# Patient Record
Sex: Male | Born: 1980 | Race: Black or African American | Hispanic: No | Marital: Single | Smoking: Never smoker
Health system: Southern US, Community
[De-identification: ages and names within clinical notes are randomized; demographics above are authoritative.]

---

## 2006-01-14 ENCOUNTER — Emergency Department (HOSPITAL_COMMUNITY): Admission: EM | Admit: 2006-01-14 | Discharge: 2006-01-14 | Payer: Self-pay | Admitting: Emergency Medicine

## 2006-11-23 ENCOUNTER — Emergency Department (HOSPITAL_COMMUNITY): Admission: EM | Admit: 2006-11-23 | Discharge: 2006-11-23 | Payer: Self-pay | Admitting: Emergency Medicine

## 2007-04-23 ENCOUNTER — Emergency Department (HOSPITAL_COMMUNITY): Admission: EM | Admit: 2007-04-23 | Discharge: 2007-04-23 | Payer: Self-pay | Admitting: Emergency Medicine

## 2008-07-07 ENCOUNTER — Emergency Department (HOSPITAL_COMMUNITY): Admission: EM | Admit: 2008-07-07 | Discharge: 2008-07-07 | Payer: Self-pay | Admitting: Emergency Medicine

## 2011-10-11 ENCOUNTER — Emergency Department (HOSPITAL_COMMUNITY)
Admission: EM | Admit: 2011-10-11 | Discharge: 2011-10-11 | Disposition: A | Discharge status: Home / Self Care | Payer: Self-pay | Attending: Emergency Medicine | Admitting: Emergency Medicine

## 2011-10-11 ENCOUNTER — Emergency Department (HOSPITAL_COMMUNITY): Payer: Self-pay

## 2011-10-11 ENCOUNTER — Encounter (HOSPITAL_COMMUNITY): Payer: Self-pay | Admitting: *Deleted

## 2011-10-11 DIAGNOSIS — S0083XA Contusion of other part of head, initial encounter: Secondary | ICD-10-CM | POA: Insufficient documentation

## 2011-10-11 DIAGNOSIS — IMO0002 Reserved for concepts with insufficient information to code with codable children: Secondary | ICD-10-CM | POA: Insufficient documentation

## 2011-10-11 DIAGNOSIS — S40019A Contusion of unspecified shoulder, initial encounter: Secondary | ICD-10-CM

## 2011-10-11 DIAGNOSIS — S0510XA Contusion of eyeball and orbital tissues, unspecified eye, initial encounter: Secondary | ICD-10-CM | POA: Insufficient documentation

## 2011-10-11 DIAGNOSIS — S0003XA Contusion of scalp, initial encounter: Secondary | ICD-10-CM | POA: Insufficient documentation

## 2011-10-11 DIAGNOSIS — S0990XA Unspecified injury of head, initial encounter: Secondary | ICD-10-CM

## 2011-10-11 MED ORDER — HYDROCODONE-ACETAMINOPHEN 5-325 MG PO TABS
1.0000 | ORAL_TABLET | Freq: Once | ORAL | Status: AC
  Administered 2011-10-11: 1 via ORAL
  Filled 2011-10-11: qty 1

## 2011-10-11 MED ORDER — HYDROCODONE-ACETAMINOPHEN 5-325 MG PO TABS: 1.0000 | ORAL_TABLET | Freq: Four times a day (QID) | ORAL | Status: AC | PRN

## 2011-10-11 NOTE — Discharge Instructions (Signed)
Follow up as needed
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
## 2016-06-30 ENCOUNTER — Encounter (HOSPITAL_COMMUNITY): Payer: Self-pay | Admitting: Emergency Medicine

## 2016-06-30 ENCOUNTER — Emergency Department (HOSPITAL_COMMUNITY)
Admission: EM | Admit: 2016-06-30 | Discharge: 2016-06-30 | Disposition: A | Discharge status: Home / Self Care | Payer: Self-pay | Attending: Emergency Medicine | Admitting: Emergency Medicine

## 2016-06-30 ENCOUNTER — Emergency Department (HOSPITAL_COMMUNITY): Payer: Self-pay

## 2016-06-30 DIAGNOSIS — Y939 Activity, unspecified: Secondary | ICD-10-CM | POA: Insufficient documentation

## 2016-06-30 DIAGNOSIS — Y999 Unspecified external cause status: Secondary | ICD-10-CM | POA: Insufficient documentation

## 2016-06-30 DIAGNOSIS — X58XXXA Exposure to other specified factors, initial encounter: Secondary | ICD-10-CM | POA: Insufficient documentation

## 2016-06-30 DIAGNOSIS — S39012A Strain of muscle, fascia and tendon of lower back, initial encounter: Secondary | ICD-10-CM | POA: Insufficient documentation

## 2016-06-30 DIAGNOSIS — Y929 Unspecified place or not applicable: Secondary | ICD-10-CM | POA: Insufficient documentation

## 2016-06-30 MED ORDER — DEXAMETHASONE 4 MG PO TABS: 4.0000 mg | ORAL_TABLET | Freq: Two times a day (BID) | ORAL | 0 refills | Status: AC

## 2016-06-30 MED ORDER — DIAZEPAM 5 MG PO TABS
10.0000 mg | ORAL_TABLET | Freq: Once | ORAL | Status: AC
  Administered 2016-06-30: 10 mg via ORAL
  Filled 2016-06-30: qty 2

## 2016-06-30 MED ORDER — HYDROCODONE-ACETAMINOPHEN 5-325 MG PO TABS
2.0000 | ORAL_TABLET | Freq: Once | ORAL | Status: AC
  Administered 2016-06-30: 2 via ORAL
  Filled 2016-06-30: qty 2

## 2016-06-30 MED ORDER — ONDANSETRON HCL 4 MG PO TABS: 4.0000 mg | ORAL_TABLET | Freq: Four times a day (QID) | ORAL | 0 refills | Status: AC

## 2016-06-30 MED ORDER — CYCLOBENZAPRINE HCL 10 MG PO TABS: 10.0000 mg | ORAL_TABLET | Freq: Three times a day (TID) | ORAL | 0 refills | Status: AC

## 2016-06-30 MED ORDER — ONDANSETRON HCL 4 MG PO TABS
4.0000 mg | ORAL_TABLET | Freq: Once | ORAL | Status: AC
  Administered 2016-06-30: 4 mg via ORAL
  Filled 2016-06-30: qty 1
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
## 2016-06-30 NOTE — ED Provider Notes (Signed)
[facility]-EMERGENCY DEPT Provider Note   CSN: [id number] Arrival date & time: 06/30/16  [id number]     History   Chief Complaint Chief Complaint  Patient presents with  . Back Pain    HPI Micheal Carpenter is a 36 y.o. male.  Patient is a 36 year old male who presents to the emergency department with complaint of low back pain and tingling in both legs.  The patient states that on last night he started having some pain in his lower back. He states he's had some pains in his back and his neck area in the past but he can usually rest or use some over-the-counter medication and it goes away. He states that this morning he could not take the pain. He states that he had to use a standup most of the night, or feel his knees beside the bed. The pain is aggravated with change of position. Nothing really seems to make it better other than sometimes standing up and being still. He has not had any recent operations or procedures involving his back. He does not recall any recent injury. He does state however that he was standing on an object, and he removed a wide screen television, but he states that he did not feel any discomfort when he was doing the moving a few days ago. There's been no loss of bowel or bladder function. Is no recurrence fall.      History reviewed. No pertinent past medical history.  There are no active problems to display for this patient.   History reviewed. No pertinent surgical history.     Home Medications    Prior to Admission medications   Not on File    Family History History reviewed. No pertinent family history.  Social History Social History  Substance Use Topics  . Smoking status: Never Smoker  . Smokeless tobacco: Not on file  . Alcohol use Yes     Allergies   Aspirin   Review of Systems Review of Systems  Musculoskeletal: Positive for back pain.  All other systems reviewed and are negative.    Physical Exam Updated Vital Signs BP 118/88 (BP  Location: Left Arm)   Pulse (!) 55   Temp 98.7 F (37.1 C) (Oral)   Resp 18   Ht 6\' 6"  (1.981 m)   Wt 77.1 kg   SpO2 100%   BMI 19.65 kg/m   Physical Exam  Constitutional: He is oriented to person, place, and time. He appears well-developed and well-nourished.  Non-toxic appearance.  HENT:  Head: Normocephalic.  Right Ear: Tympanic membrane and external ear normal.  Left Ear: Tympanic membrane and external ear normal.  Eyes: EOM and lids are normal. Pupils are equal, round, and reactive to light.  Neck: Normal range of motion. Neck supple. Carotid bruit is not present.  Cardiovascular: Normal rate, regular rhythm, normal heart sounds, intact distal pulses and normal pulses.   Pulmonary/Chest: Breath sounds normal. No respiratory distress.  Abdominal: Soft. Bowel sounds are normal. There is no tenderness. There is no guarding.  Musculoskeletal: Normal range of motion.  There is pain with change of position at the lower lumbar area. There is no palpable step off of the cervical, thoracic, or lumbar region. There is no paraspinal tenderness to palpation at the lumbar area. There no hot areas appreciated.  Lymphadenopathy:       Head (right side): No submandibular adenopathy present.       Head (left side): No submandibular adenopathy present.  He has no cervical adenopathy.  Neurological: He is alert and oriented to person, place, and time. He has normal strength. No cranial nerve deficit or sensory deficit.  Gait is slow but steady. There no motor or sensory deficits appreciated.  Skin: Skin is warm and dry.  Psychiatric: He has a normal mood and affect. His speech is normal.  Nursing note and vitals reviewed.    ED Treatments / Results  Labs (all labs ordered are listed, but only abnormal results are displayed) Labs Reviewed - No data to display  EKG  EKG Interpretation None       Radiology Ct Lumbar Spine Wo Contrast  Result Date: 06/30/2016 CLINICAL DATA:  Low  back pain. Tingling sensation to bilateral legs. No known injury. EXAM: CT LUMBAR SPINE WITHOUT CONTRAST TECHNIQUE: Multidetector CT imaging of the lumbar spine was performed without intravenous contrast administration. Multiplanar CT image reconstructions were also generated. COMPARISON:  None. FINDINGS: Segmentation: 5 lumbar type vertebrae. Alignment: Normal Vertebrae: No fracture or focal pathologic process. Paraspinal and other soft tissues: Negative Disc levels: Disc spaces maintained. No disc herniation. No neural foraminal or central spinal stenosis. IMPRESSION: No acute or significant abnormality. Electronically Signed   By: Charlett Nose M.D.   On: 06/30/2016 13:03    Procedures Procedures (including critical care time)  Medications Ordered in ED Medications  diazepam (VALIUM) tablet 10 mg (10 mg Oral Given 06/30/16 1154)  HYDROcodone-acetaminophen (NORCO/VICODIN) 5-325 MG per tablet 2 tablet (2 tablets Oral Given 06/30/16 1153)  ondansetron (ZOFRAN) tablet 4 mg (4 mg Oral Given 06/30/16 1153)     Initial Impression / Assessment and Plan / ED Course  I have reviewed the triage vital signs and the nursing notes.  Pertinent labs & imaging results that were available during my care of the patient were reviewed by me and considered in my medical decision making (see chart for details).     **I have reviewed nursing notes, vital signs, and all appropriate lab and imaging results for this patient.*  Final Clinical Impressions(s) / ED Diagnoses  MDM  No gross neurologic deficits appreciated. Pain reproduced with change of position. At times the patient was standing, and at times the patient had to get down on his knees and angle his back in a certain position to decrease the pain and discomfort. The patient was treated with Valium and hydrocodone. The patient received some improvement. CT scan of the lumbar spine is negative for fracture, dislocation, or evidence of disc disease.  I  discussed these findings with the patient in terms which he understands. The patient states that he feels a lot better after the medication. Suspect the patient has a deep muscle strain. The patient is asked to rest his back, to use a heating pad. The patient will be provided with prescription for Flexeril, Decadron, and diclofenac. The patient is to follow-up with Dr. Romeo Apple for orthopedic evaluation and management if not improving.    Final diagnoses:  None    New Prescriptions New Prescriptions   No medications on file     Ivery Quale, PA-C 06/30/16 1428    Loren Racer, MD 07/02/16 [phone]

## 2016-06-30 NOTE — ED Notes (Signed)
Pt on his knees in floor laying across bed stating that is most comfortable position

## 2016-06-30 NOTE — Discharge Instructions (Signed)
Please use a heating pad to your lower back. Please rest your back is much possible. Use Flexeril, Decadron, for your back pain. Your CT scan is negative for acute problem. I suspect you have a muscle strain. Please see Dr. Romeo AppleHarrison for orthopedic evaluation and management if not improving.

## 2018-02-26 IMAGING — CT CT L SPINE W/O CM
3 series · 12 of 33 positions shown, 14 images · non-contrast
Comparison: None.

CLINICAL DATA: Low back pain. Tingling sensation to bilateral legs.
No known injury.

EXAM:
CT LUMBAR SPINE WITHOUT CONTRAST
TECHNIQUE: Multidetector CT imaging of the lumbar spine was performed without
intravenous contrast administration. Multiplanar CT image
reconstructions were also generated.

[Series 4: l spine soft · axial · 0.36mm/px · z∈[+1114,+1306]mm · 4 of 140 slices shown, 5 images]
[im 22/140  soft-tissue]
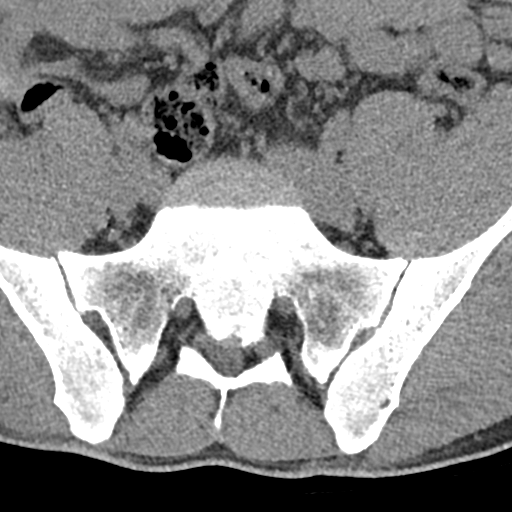
[im 22/140  bone]
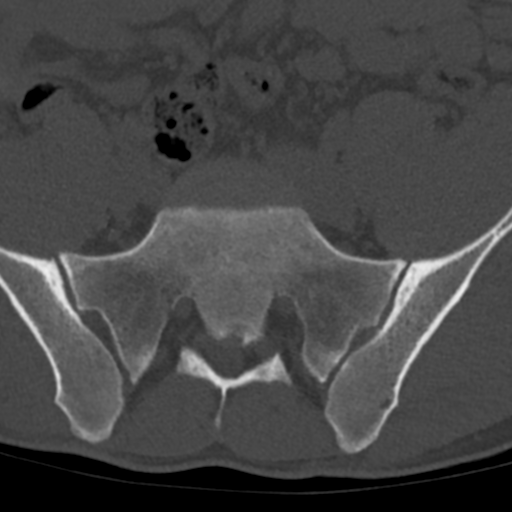
[im 54/140  bone]
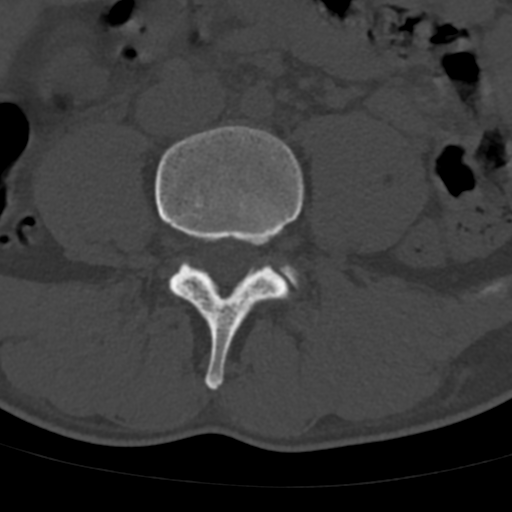
[im 86/140  bone]
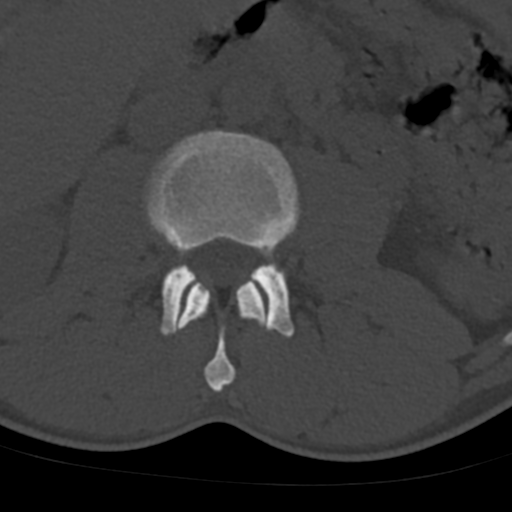
[im 118/140  bone]
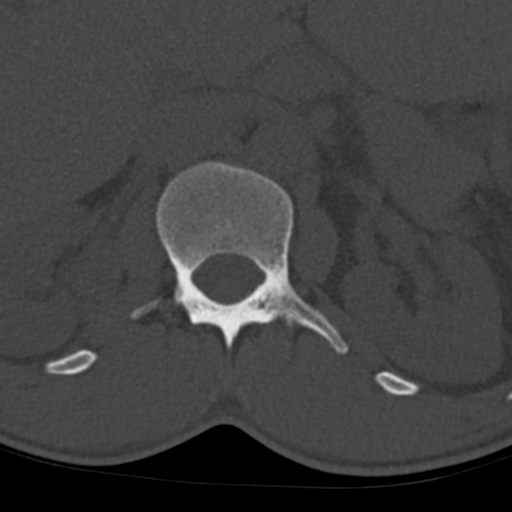

[Series 5: sagittal bone · sagittal · 0.33mm/px · 5 of 82 slices shown, 6 images]
[im 28/82  bone]
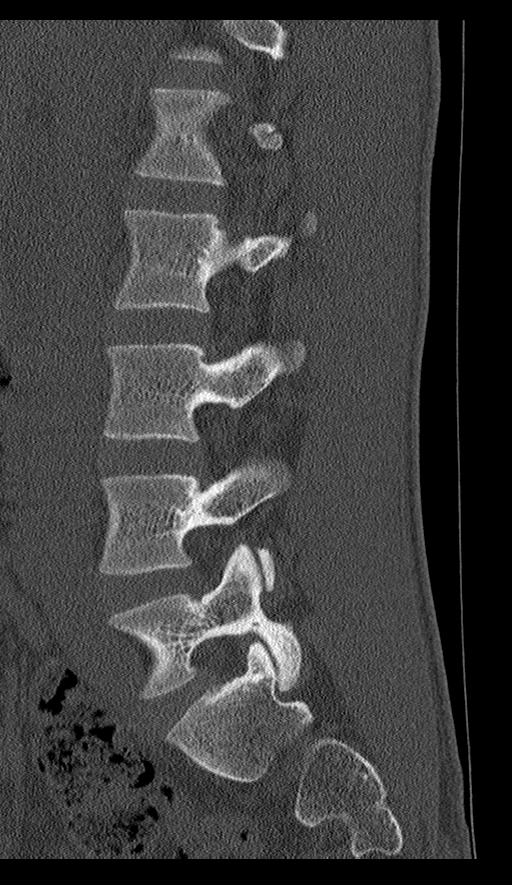
[im 34/82  bone]
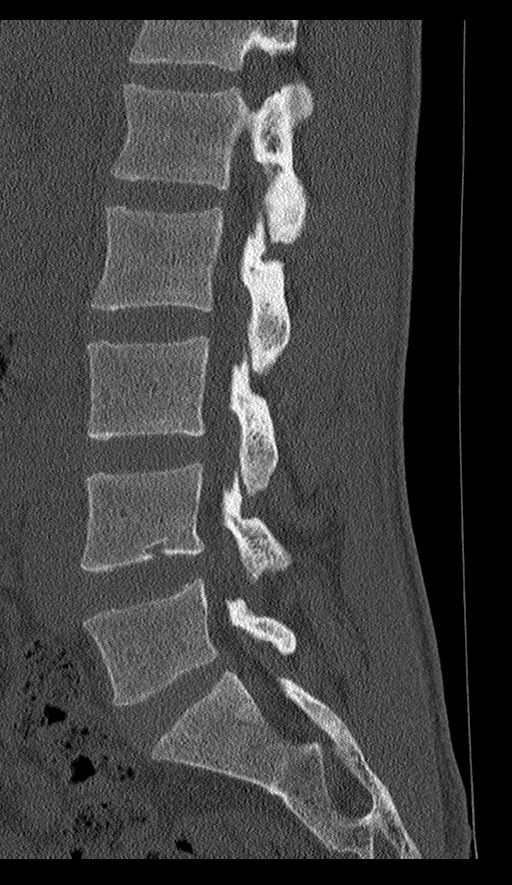
[im 41/82  soft-tissue]
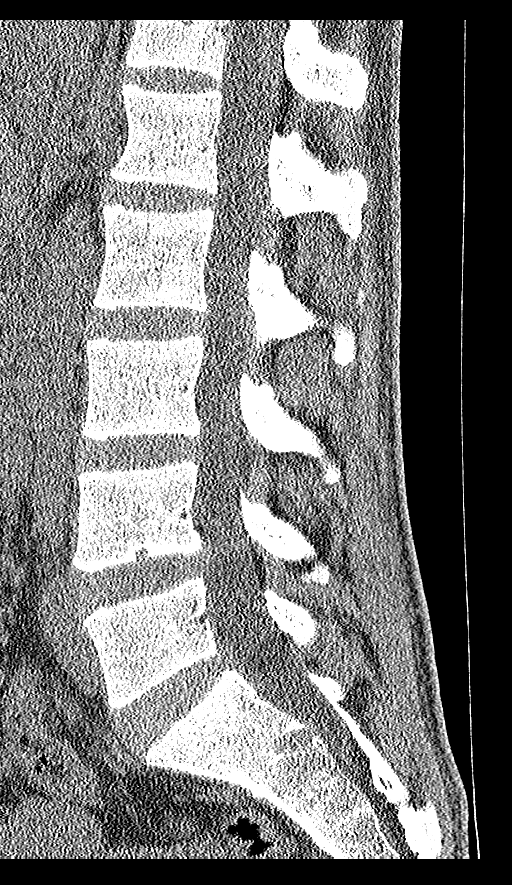
[im 41/82  bone]
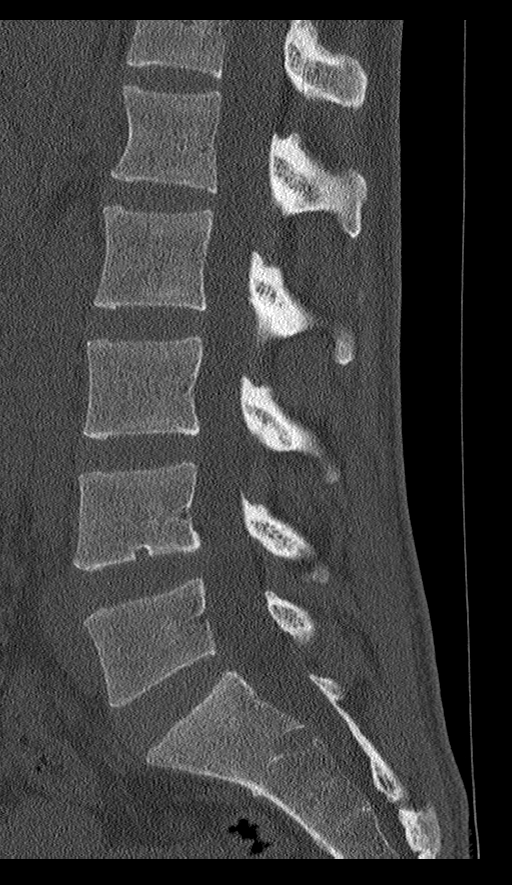
[im 48/82  bone]
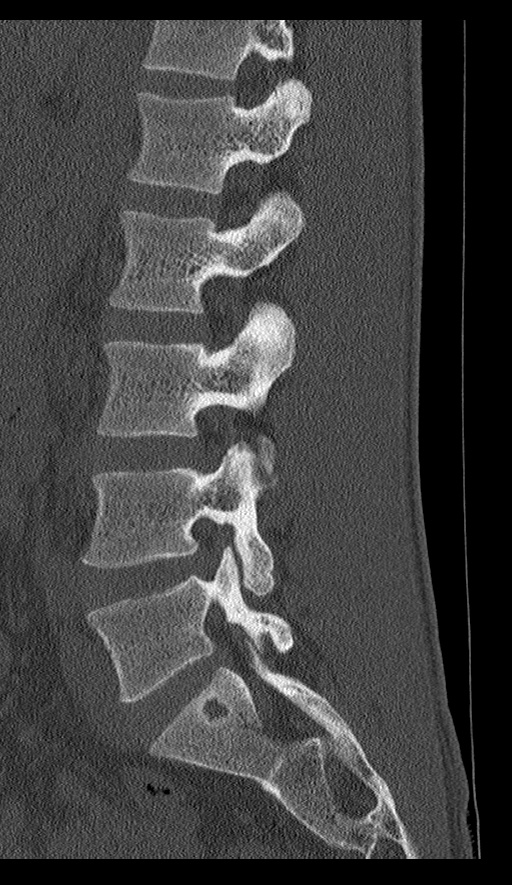
[im 55/82  bone]
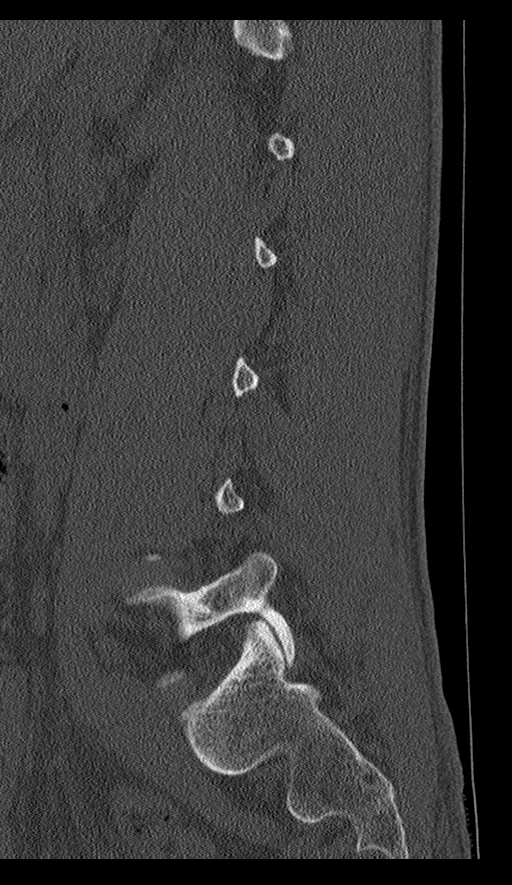

[Series 6: coronal bone · coronal · 0.42mm/px · 3 of 68 slices shown]
[im 14/68  bone]
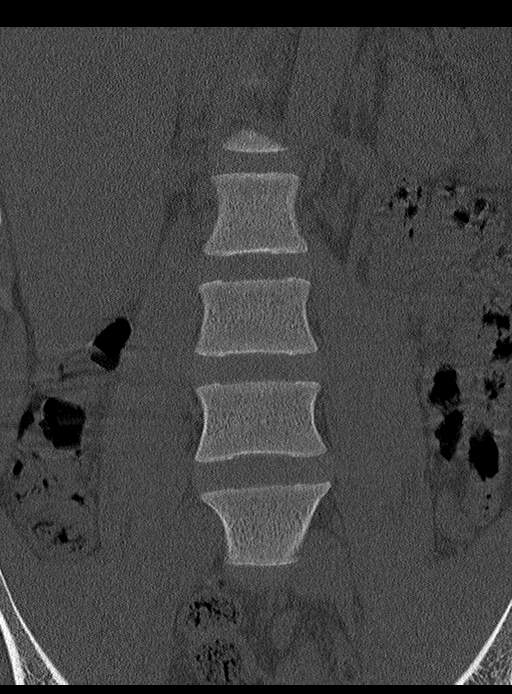
[im 27/68  bone]
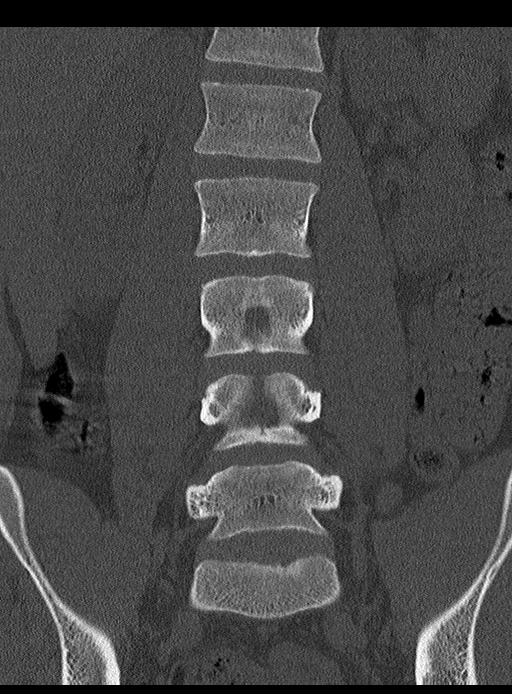
[im 41/68  bone]
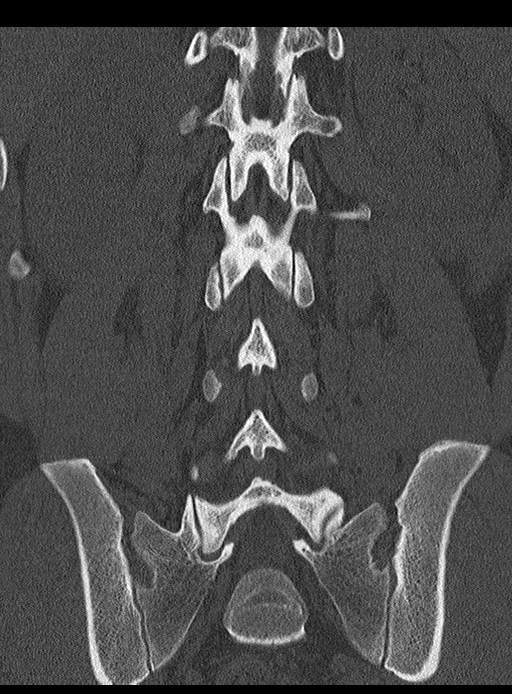

[12 of 33 positions shown; findings below may reference images not displayed]

FINDINGS: Segmentation: 5 lumbar type vertebrae.

Alignment: Normal

Vertebrae: No fracture or focal pathologic process.

Paraspinal and other soft tissues: Negative

Disc levels: Disc spaces maintained. No disc herniation. No neural
foraminal or central spinal stenosis.
IMPRESSION: No acute or significant abnormality.

## 2022-09-16 DIAGNOSIS — Z419 Encounter for procedure for purposes other than remedying health state, unspecified: Secondary | ICD-10-CM | POA: Diagnosis not present

## 2022-10-17 DIAGNOSIS — Z419 Encounter for procedure for purposes other than remedying health state, unspecified: Secondary | ICD-10-CM | POA: Diagnosis not present

## 2022-11-16 DIAGNOSIS — Z419 Encounter for procedure for purposes other than remedying health state, unspecified: Secondary | ICD-10-CM | POA: Diagnosis not present

## 2022-12-17 DIAGNOSIS — Z419 Encounter for procedure for purposes other than remedying health state, unspecified: Secondary | ICD-10-CM | POA: Diagnosis not present

## 2023-01-17 DIAGNOSIS — Z419 Encounter for procedure for purposes other than remedying health state, unspecified: Secondary | ICD-10-CM | POA: Diagnosis not present

## 2023-02-16 DIAGNOSIS — Z419 Encounter for procedure for purposes other than remedying health state, unspecified: Secondary | ICD-10-CM | POA: Diagnosis not present

## 2023-03-19 DIAGNOSIS — Z419 Encounter for procedure for purposes other than remedying health state, unspecified: Secondary | ICD-10-CM | POA: Diagnosis not present
